# Patient Record
Sex: Male | Born: 1960 | Race: White | Hispanic: No | Marital: Married | Smoking: Never smoker
Health system: Southern US, Community
[De-identification: ages and names within clinical notes are randomized; demographics above are authoritative.]

## PROBLEM LIST (undated history)

## (undated) DIAGNOSIS — D126 Benign neoplasm of colon, unspecified: Secondary | ICD-10-CM

## (undated) DIAGNOSIS — K219 Gastro-esophageal reflux disease without esophagitis: Secondary | ICD-10-CM

## (undated) DIAGNOSIS — G8929 Other chronic pain: Secondary | ICD-10-CM

## (undated) DIAGNOSIS — E559 Vitamin D deficiency, unspecified: Secondary | ICD-10-CM

## (undated) DIAGNOSIS — K2210 Ulcer of esophagus without bleeding: Secondary | ICD-10-CM

## (undated) DIAGNOSIS — Z8719 Personal history of other diseases of the digestive system: Secondary | ICD-10-CM

## (undated) DIAGNOSIS — E785 Hyperlipidemia, unspecified: Secondary | ICD-10-CM

## (undated) DIAGNOSIS — K2970 Gastritis, unspecified, without bleeding: Secondary | ICD-10-CM

## (undated) DIAGNOSIS — E349 Endocrine disorder, unspecified: Secondary | ICD-10-CM

## (undated) DIAGNOSIS — T7840XA Allergy, unspecified, initial encounter: Secondary | ICD-10-CM

## (undated) DIAGNOSIS — K5790 Diverticulosis of intestine, part unspecified, without perforation or abscess without bleeding: Secondary | ICD-10-CM

## (undated) DIAGNOSIS — E291 Testicular hypofunction: Secondary | ICD-10-CM

## (undated) DIAGNOSIS — K649 Unspecified hemorrhoids: Secondary | ICD-10-CM

## (undated) HISTORY — PX: COLONOSCOPY: SHX174

## (undated) HISTORY — PX: HERNIA REPAIR: SHX51

## (undated) HISTORY — PX: ESOPHAGOGASTRODUODENOSCOPY: SHX1529

---
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
## 2007-04-23 ENCOUNTER — Ambulatory Visit: Payer: Self-pay | Admitting: Internal Medicine

## 2007-07-09 ENCOUNTER — Ambulatory Visit: Payer: Self-pay | Admitting: Rheumatology

## 2007-08-27 ENCOUNTER — Ambulatory Visit: Payer: Self-pay | Admitting: Surgery

## 2007-08-27 ENCOUNTER — Other Ambulatory Visit: Payer: Self-pay

## 2007-09-04 ENCOUNTER — Ambulatory Visit: Payer: Self-pay | Admitting: Surgery

## 2008-03-10 ENCOUNTER — Ambulatory Visit: Payer: Self-pay | Admitting: Family Medicine

## 2008-08-16 ENCOUNTER — Ambulatory Visit: Payer: Self-pay | Admitting: Gastroenterology

## 2008-09-15 ENCOUNTER — Ambulatory Visit: Payer: Self-pay | Admitting: Family Medicine

## 2009-03-15 ENCOUNTER — Ambulatory Visit: Payer: Self-pay | Admitting: Family Medicine

## 2009-10-13 ENCOUNTER — Ambulatory Visit: Payer: Self-pay | Admitting: Gastroenterology

## 2011-12-31 ENCOUNTER — Ambulatory Visit: Payer: Self-pay | Admitting: Gastroenterology

## 2012-09-16 ENCOUNTER — Ambulatory Visit: Payer: Self-pay | Admitting: Podiatry

## 2013-04-20 ENCOUNTER — Ambulatory Visit: Payer: Self-pay | Admitting: Surgery

## 2013-04-20 LAB — CBC WITH DIFFERENTIAL/PLATELET
HGB: 17.9 g/dL (ref 13.0–18.0)
Lymphocyte %: 28.6 %
Monocyte %: 10.4 %
Neutrophil %: 58.4 %
Platelet: 240 10*3/uL (ref 150–440)
RBC: 5.97 10*6/uL — ABNORMAL HIGH (ref 4.40–5.90)
RDW: 14.3 % (ref 11.5–14.5)
WBC: 10.1 10*3/uL (ref 3.8–10.6)

## 2013-04-27 ENCOUNTER — Ambulatory Visit: Payer: Self-pay | Admitting: Surgery

## 2014-03-01 ENCOUNTER — Ambulatory Visit: Payer: Self-pay | Admitting: Gastroenterology

## 2014-03-01 DIAGNOSIS — K2210 Ulcer of esophagus without bleeding: Secondary | ICD-10-CM

## 2014-03-01 HISTORY — DX: Ulcer of esophagus without bleeding: K22.10

## 2014-03-03 LAB — PATHOLOGY REPORT

## 2015-02-17 NOTE — Op Note (Signed)
PATIENT NAME:  Bryan Burch, Bryan Burch MR#:  [id number] DATE OF BIRTH:  04-14-1961  DATE OF PROCEDURE:  04/27/2013  PREOPERATIVE DIAGNOSIS: Umbilical hernia.   POSTOPERATIVE DIAGNOSIS: Umbilical hernia.   OPERATION: Umbilical hernia repair.   ANESTHESIA: General.   SURGEON: Micheline Maze, MD  ASSISTANT: Patience Musca, [employer] student.   DESCRIPTION OF PROCEDURE: With the patient in the supine position, after the induction of appropriate general anesthesia, the patient's abdomen was prepped with ChloraPrep and draped with sterile towels. A supraumbilical incision was made in the standard fashion and carried down bluntly through the subcutaneous tissue. Electrocautery was used to provide hemostasis. The hernia sac was identified and dissected back to its base, which appeared to be approximately a centimeter defect in the anterior abdominal wall. I did not see any other evidence of defects. The sac was dissected back to its base, the peritoneal fat removed where it could not be reduced. The defect was closed with a pants-over-vest closure of 0 Surgilon. The area was infiltrated with 0.5% Marcaine for postoperative pain control. The umbilical defect was reapproximated using 0 Vicryl, and the skin was clipped. Compressive dressing was applied. The patient was returned to the recovery room having tolerated the procedure well. Sponge, instrument and needle counts were correct x2 in the operating room.   ____________________________ Bryan Burch III, MD rle:OSi D: 04/27/2013 09:37:10 ET T: 04/27/2013 09:58:03 ET JOB#: [id number]  cc: Micheline Maze, MD, <Dictator> Sofie Hartigan, MD Bryan Goldmann MD ELECTRONICALLY SIGNED 04/30/2013 8:24

## 2016-11-28 ENCOUNTER — Encounter: Payer: Self-pay | Admitting: *Deleted

## 2016-11-29 ENCOUNTER — Ambulatory Visit
Admission: RE | Admit: 2016-11-29 | Discharge: 2016-11-29 | Disposition: A | Discharge status: Home / Self Care | Payer: 59 | Source: Ambulatory Visit | Attending: Gastroenterology | Admitting: Gastroenterology

## 2016-11-29 ENCOUNTER — Ambulatory Visit: Payer: 59 | Admitting: Anesthesiology

## 2016-11-29 ENCOUNTER — Encounter
Admission: RE | Disposition: A | Discharge status: Home / Self Care | Payer: Self-pay | Source: Ambulatory Visit | Attending: Gastroenterology

## 2016-11-29 ENCOUNTER — Encounter: Payer: Self-pay | Admitting: *Deleted

## 2016-11-29 DIAGNOSIS — K2950 Unspecified chronic gastritis without bleeding: Secondary | ICD-10-CM | POA: Insufficient documentation

## 2016-11-29 DIAGNOSIS — Z79899 Other long term (current) drug therapy: Secondary | ICD-10-CM | POA: Diagnosis not present

## 2016-11-29 DIAGNOSIS — Z800 Family history of malignant neoplasm of digestive organs: Secondary | ICD-10-CM | POA: Insufficient documentation

## 2016-11-29 DIAGNOSIS — Z9109 Other allergy status, other than to drugs and biological substances: Secondary | ICD-10-CM | POA: Insufficient documentation

## 2016-11-29 DIAGNOSIS — K210 Gastro-esophageal reflux disease with esophagitis: Secondary | ICD-10-CM | POA: Diagnosis not present

## 2016-11-29 DIAGNOSIS — E785 Hyperlipidemia, unspecified: Secondary | ICD-10-CM | POA: Diagnosis not present

## 2016-11-29 DIAGNOSIS — Z86010 Personal history of colonic polyps: Secondary | ICD-10-CM | POA: Diagnosis not present

## 2016-11-29 DIAGNOSIS — E559 Vitamin D deficiency, unspecified: Secondary | ICD-10-CM | POA: Insufficient documentation

## 2016-11-29 DIAGNOSIS — K224 Dyskinesia of esophagus: Secondary | ICD-10-CM | POA: Diagnosis not present

## 2016-11-29 DIAGNOSIS — K2270 Barrett's esophagus without dysplasia: Secondary | ICD-10-CM | POA: Insufficient documentation

## 2016-11-29 DIAGNOSIS — Z807 Family history of other malignant neoplasms of lymphoid, hematopoietic and related tissues: Secondary | ICD-10-CM | POA: Insufficient documentation

## 2016-11-29 HISTORY — DX: Hyperlipidemia, unspecified: E78.5

## 2016-11-29 HISTORY — PX: ESOPHAGOGASTRODUODENOSCOPY (EGD) WITH PROPOFOL: SHX5813

## 2016-11-29 HISTORY — DX: Personal history of other diseases of the digestive system: Z87.19

## 2016-11-29 HISTORY — DX: Allergy, unspecified, initial encounter: T78.40XA

## 2016-11-29 HISTORY — DX: Testicular hypofunction: E29.1

## 2016-11-29 HISTORY — DX: Vitamin D deficiency, unspecified: E55.9

## 2016-11-29 HISTORY — DX: Ulcer of esophagus without bleeding: K22.10

## 2016-11-29 HISTORY — DX: Unspecified hemorrhoids: K64.9

## 2016-11-29 HISTORY — DX: Gastritis, unspecified, without bleeding: K29.70

## 2016-11-29 HISTORY — DX: Diverticulosis of intestine, part unspecified, without perforation or abscess without bleeding: K57.90

## 2016-11-29 HISTORY — DX: Gastro-esophageal reflux disease without esophagitis: K21.9

## 2016-11-29 HISTORY — DX: Benign neoplasm of colon, unspecified: D12.6

## 2016-11-29 SURGERY — ESOPHAGOGASTRODUODENOSCOPY (EGD) WITH PROPOFOL
Anesthesia: General

## 2016-11-29 MED ORDER — SODIUM CHLORIDE 0.9 % IV SOLN
INTRAVENOUS | Status: DC
  Administered 2016-11-29: 14:00:00 via INTRAVENOUS
  Administered 2016-11-29: 1000 mL via INTRAVENOUS

## 2016-11-29 MED ORDER — LIDOCAINE HCL (CARDIAC) 20 MG/ML IV SOLN
INTRAVENOUS | Status: DC | PRN
  Administered 2016-11-29: 100 mg via INTRAVENOUS

## 2016-11-29 MED ORDER — PHENYLEPHRINE HCL 10 MG/ML IJ SOLN
INTRAMUSCULAR | Status: AC
  Filled 2016-11-29: qty 1

## 2016-11-29 MED ORDER — PROPOFOL 500 MG/50ML IV EMUL
INTRAVENOUS | Status: DC | PRN
  Administered 2016-11-29: 150 ug/kg/min via INTRAVENOUS

## 2016-11-29 MED ORDER — PHENYLEPHRINE HCL 10 MG/ML IJ SOLN
INTRAMUSCULAR | Status: DC | PRN
  Administered 2016-11-29: 200 ug via INTRAVENOUS

## 2016-11-29 MED ORDER — PROPOFOL 10 MG/ML IV BOLUS
INTRAVENOUS | Status: DC | PRN
  Administered 2016-11-29: 60 mg via INTRAVENOUS

## 2016-11-29 MED ORDER — MIDAZOLAM HCL 2 MG/2ML IJ SOLN
INTRAMUSCULAR | Status: DC | PRN
  Administered 2016-11-29: 2 mg via INTRAVENOUS

## 2016-11-29 MED ORDER — SODIUM CHLORIDE 0.9 % IV SOLN: INTRAVENOUS | Status: DC

## 2016-11-29 MED ORDER — LIDOCAINE HCL (PF) 2 % IJ SOLN
INTRAMUSCULAR | Status: AC
  Filled 2016-11-29: qty 2

## 2016-11-29 MED ORDER — PROPOFOL 500 MG/50ML IV EMUL
INTRAVENOUS | Status: AC
  Filled 2016-11-29: qty 50

## 2016-11-29 MED ORDER — MIDAZOLAM HCL 2 MG/2ML IJ SOLN
INTRAMUSCULAR | Status: AC
  Filled 2016-11-29: qty 2

## 2016-11-29 NOTE — Anesthesia Postprocedure Evaluation (Signed)
Anesthesia Post Note  Patient: YACOV TAPLEY  Procedure(s) Performed: Procedure(s) (LRB): ESOPHAGOGASTRODUODENOSCOPY (EGD) WITH PROPOFOL (N/A)  Patient location during evaluation: PACU Anesthesia Type: General Level of consciousness: awake Pain management: pain level controlled Vital Signs Assessment: post-procedure vital signs reviewed and stable Respiratory status: spontaneous breathing Cardiovascular status: stable Anesthetic complications: no     Last Vitals:  Vitals:   11/29/16 1350 11/29/16 1436  BP: 126/89 98/79  Pulse: 76 82  Resp: 18 (!) 30  Temp: 36.3 C (!) 35.9 C    Last Pain:  Vitals:   11/29/16 1436  TempSrc: Tympanic                 VAN STAVEREN,Siona Coulston

## 2016-11-29 NOTE — Anesthesia Post-op Follow-up Note (Cosign Needed)
Anesthesia QCDR form completed.        

## 2016-11-29 NOTE — Transfer of Care (Signed)
Immediate Anesthesia Transfer of Care Note  Patient: Bryan Burch  Procedure(s) Performed: Procedure(s): ESOPHAGOGASTRODUODENOSCOPY (EGD) WITH PROPOFOL (N/A)  Patient Location: Endoscopy Unit  Anesthesia Type:General  Level of Consciousness: awake and patient cooperative  Airway & Oxygen Therapy: Patient Spontanous Breathing and Patient connected to nasal cannula oxygen  Post-op Assessment: Report given to RN, Post -op Vital signs reviewed and stable and Patient moving all extremities X 4  Post vital signs: Reviewed and stable  Last Vitals:  Vitals:   11/29/16 1350 11/29/16 1436  BP: 126/89 (P) 98/79  Pulse: 76 (P) 82  Resp: 18 (!) (P) 30  Temp: 36.3 C (!) (P) 35.9 C    Last Pain:  Vitals:   11/29/16 1436  TempSrc: (P) Tympanic         Complications: No apparent anesthesia complications

## 2016-11-29 NOTE — Op Note (Signed)
[facility] Patient Name: Bryan Burch Procedure Date: 11/29/2016 2:13 PM MRN: [id number] Account #: [id number] Date of Birth: 07/22/1961 Admit Type: Outpatient Age: 56 Room: [facility] ENDO ROOM 3 Gender: Male Note Status: Finalized Procedure:            Upper GI endoscopy Indications:          Follow-up of Barrett's esophagus Providers:            Lollie Sails, MD Referring MD:         Bo Mcclintock. Vickki Muff (Referring MD) Medicines:            Monitored Anesthesia Care Complications:        No immediate complications. Procedure:            Pre-Anesthesia Assessment:                       - ASA Grade Assessment: II - A patient with mild                        systemic disease.                       After obtaining informed consent, the endoscope was                        passed under direct vision. Throughout the procedure,                        the patient's blood pressure, pulse, and oxygen                        saturations were monitored continuously. The Endoscope                        was introduced through the mouth, and advanced to the                        third part of duodenum. The upper GI endoscopy was                        accomplished without difficulty. The patient tolerated                        the procedure well. Findings:      There were esophageal mucosal changes secondary to established       short-segment Barrett's disease present at the gastroesophageal       junction. The maximum longitudinal extent of these mucosal changes was 1       cm in length. Mucosa was biopsied with a cold forceps for histology in 4       quadrants at 40 cm from the incisors. One specimen bottle was sent to       pathology.      Abnormal motility was noted in the distal esophagus. The cricopharyngeus       was normal. There is spasticity of the esophageal body.      The cardia and gastric fundus were normal on retroflexion.      Localized minimal  inflammation characterized by congestion (edema),       erythema and granularity was found in the gastric antrum. Biopsies were  taken with a cold forceps for histology. Biopsies were taken with a cold       forceps for Helicobacter pylori testing.      The examined duodenum was normal. Impression:           - Esophageal mucosal changes secondary to established                        short-segment Barrett's disease. Biopsied.                       - Abnormal esophageal motility.                       - Gastritis. Biopsied.                       - Normal examined duodenum. Recommendation:       - Continue present medications.                       - Await pathology results.                       - Telephone GI clinic for pathology results in 1 week.                       - Soft diet for 2 days. Procedure Code(s):    --- Professional ---                       [phone], Esophagogastroduodenoscopy, flexible, transoral;                        with biopsy, single or multiple Diagnosis Code(s):    --- Professional ---                       K22.70, Barrett's esophagus without dysplasia                       K22.4, Dyskinesia of esophagus                       K29.70, Gastritis, unspecified, without bleeding CPT copyright 2016 American Medical Association. All rights reserved. The codes documented in this report are preliminary and upon coder review may  be revised to meet current compliance requirements. Lollie Sails, MD 11/29/2016 2:36:06 PM This report has been signed electronically. Number of Addenda: 0 Note Initiated On: 11/29/2016 2:13 PM      [facility]

## 2016-11-29 NOTE — Anesthesia Preprocedure Evaluation (Signed)
Anesthesia Evaluation  Patient identified by MRN, date of birth, ID band Patient awake    Reviewed: Allergy & Precautions, NPO status , Patient's Chart, lab work & pertinent test results  Airway Mallampati: I       Dental  (+) Teeth Intact   Pulmonary neg pulmonary ROS,    breath sounds clear to auscultation       Cardiovascular Exercise Tolerance: Good  Rhythm:Regular     Neuro/Psych negative neurological ROS     GI/Hepatic Neg liver ROS, GERD  Medicated,  Endo/Other  Morbid obesity  Renal/GU negative Renal ROS     Musculoskeletal negative musculoskeletal ROS (+)   Abdominal (+) + obese,   Peds negative pediatric ROS (+)  Hematology negative hematology ROS (+)   Anesthesia Other Findings   Reproductive/Obstetrics                             Anesthesia Physical Anesthesia Plan  ASA: II  Anesthesia Plan: General   Post-op Pain Management:    Induction: Intravenous  Airway Management Planned: Natural Airway and Nasal Cannula  Additional Equipment:   Intra-op Plan:   Post-operative Plan:   Informed Consent: I have reviewed the patients History and Physical, chart, labs and discussed the procedure including the risks, benefits and alternatives for the proposed anesthesia with the patient or authorized representative who has indicated his/her understanding and acceptance.     Plan Discussed with: CRNA and Surgeon  Anesthesia Plan Comments:         Anesthesia Quick Evaluation

## 2016-11-29 NOTE — H&P (Signed)
Outpatient short stay form Pre-procedure 11/29/2016 2:11 PM Lollie Sails MD  Primary Physician: Dr. Kirkland Hun  Reason for visit:  EGD  History of present illness:  Patient is a 56 year old male presenting today as above. Is personal history of Barrett's esophagus. He is presenting today for surveillance procedure. He takes no aspirin or blood thinning agents. He continues on a proton pump inhibitor, DEXilant, this seems to be very effective for him.    Current Facility-Administered Medications:  .  0.9 %  sodium chloride infusion, , Intravenous, Continuous, Lollie Sails, MD, Last Rate: 20 mL/hr at 11/29/16 1405, 1,000 mL at 11/29/16 1405 .  0.9 %  sodium chloride infusion, , Intravenous, Continuous, Lollie Sails, MD  Prescriptions Prior to Admission  Medication Sig Dispense Refill Last Dose  . acyclovir (ZOVIRAX) 400 MG tablet Take 400 mg by mouth 3 (three) times daily.   Past Week at Unknown time  . cetirizine (ZYRTEC) 10 MG tablet Take 10 mg by mouth daily.   11/28/2016 at Unknown time  . chlorpheniramine (CHLOR-TRIMETON) 4 MG tablet Take 4 mg by mouth at bedtime as needed for allergies.   Past Week at Unknown time  . Cholecalciferol (VITAMIN D3) 1000 units CAPS Take 3,000 Units by mouth daily.   11/28/2016 at Unknown time  . Cyanocobalamin (VITAMIN B 12 PO) Take by mouth daily.   11/28/2016 at Unknown time  . dexlansoprazole (DEXILANT) 60 MG capsule Take 60 mg by mouth daily.   11/28/2016 at Unknown time  . niacin 100 MG tablet Take 100 mg by mouth at bedtime.   11/28/2016 at Unknown time  . rosuvastatin (CRESTOR) 40 MG tablet Take 40 mg by mouth daily.   11/28/2016 at Unknown time  . testosterone (ANDROGEL) 50 MG/5GM (1%) GEL Place 5 g onto the skin daily.   Past Week at Unknown time  . traMADol-acetaminophen (ULTRACET) 37.5-325 MG tablet Take 1 tablet by mouth every 6 (six) hours as needed.   Past Week at Unknown time     Not on File   Past Medical History:  Diagnosis  Date  . Allergic state   . Androgen deficiency   . Diverticulosis   . Erosive esophagitis 03/01/2014  . Gastritis   . GERD (gastroesophageal reflux disease)   . Hemorrhoids   . History of Barrett's esophagus   . Hyperlipidemia   . Tubular adenoma of colon   . Vitamin D deficiency     Review of systems:      Physical Exam    Heart and lungs: Regular rate and rhythm without rub or gallop, lungs are bilaterally clear.    HEENT: Normocephalic atraumatic eyes are anicteric    Other:     Pertinant exam for procedure: Soft nontender nondistended bowel sounds positive normoactive.    Planned proceedures: EGD and indicated procedures. I have discussed the risks benefits and complications of procedures to include not limited to bleeding, infection, perforation and the risk of sedation and the patient wishes to proceed.    Lollie Sails, MD Gastroenterology 11/29/2016  2:11 PM

## 2016-12-02 ENCOUNTER — Encounter: Payer: Self-pay | Admitting: Gastroenterology

## 2016-12-03 LAB — SURGICAL PATHOLOGY

## 2019-02-18 ENCOUNTER — Other Ambulatory Visit (HOSPITAL_COMMUNITY): Payer: Self-pay | Admitting: Gastroenterology

## 2019-02-18 ENCOUNTER — Other Ambulatory Visit: Payer: Self-pay | Admitting: Gastroenterology

## 2019-02-18 DIAGNOSIS — R1314 Dysphagia, pharyngoesophageal phase: Secondary | ICD-10-CM

## 2019-03-29 ENCOUNTER — Other Ambulatory Visit: Payer: Self-pay

## 2019-03-29 ENCOUNTER — Ambulatory Visit
Admission: RE | Admit: 2019-03-29 | Discharge: 2019-03-29 | Disposition: A | Discharge status: Home / Self Care | Payer: No Typology Code available for payment source | Source: Ambulatory Visit | Attending: Gastroenterology | Admitting: Gastroenterology

## 2019-03-29 DIAGNOSIS — R1314 Dysphagia, pharyngoesophageal phase: Secondary | ICD-10-CM | POA: Diagnosis present

## 2019-04-20 ENCOUNTER — Other Ambulatory Visit
Admission: RE | Admit: 2019-04-20 | Discharge: 2019-04-20 | Disposition: A | Discharge status: Home / Self Care | Payer: No Typology Code available for payment source | Source: Ambulatory Visit | Attending: Gastroenterology | Admitting: Gastroenterology

## 2019-04-20 ENCOUNTER — Other Ambulatory Visit: Payer: Self-pay

## 2019-04-20 DIAGNOSIS — Z1159 Encounter for screening for other viral diseases: Secondary | ICD-10-CM | POA: Insufficient documentation

## 2019-04-22 ENCOUNTER — Encounter: Payer: Self-pay | Admitting: *Deleted

## 2019-04-22 LAB — NOVEL CORONAVIRUS, NAA (HOSP ORDER, SEND-OUT TO REF LAB; TAT 18-24 HRS): SARS-CoV-2, NAA: NOT DETECTED

## 2019-04-23 ENCOUNTER — Other Ambulatory Visit: Payer: Self-pay

## 2019-04-23 ENCOUNTER — Encounter: Payer: Self-pay | Admitting: Gastroenterology

## 2019-04-23 ENCOUNTER — Ambulatory Visit: Payer: No Typology Code available for payment source | Admitting: Anesthesiology

## 2019-04-23 ENCOUNTER — Ambulatory Visit
Admission: RE | Admit: 2019-04-23 | Discharge: 2019-04-23 | Disposition: A | Discharge status: Home / Self Care | Payer: No Typology Code available for payment source | Attending: Gastroenterology | Admitting: Gastroenterology

## 2019-04-23 ENCOUNTER — Encounter
Admission: RE | Disposition: A | Discharge status: Home / Self Care | Payer: Self-pay | Source: Home / Self Care | Attending: Gastroenterology

## 2019-04-23 DIAGNOSIS — E559 Vitamin D deficiency, unspecified: Secondary | ICD-10-CM | POA: Insufficient documentation

## 2019-04-23 DIAGNOSIS — K219 Gastro-esophageal reflux disease without esophagitis: Secondary | ICD-10-CM | POA: Diagnosis not present

## 2019-04-23 DIAGNOSIS — E669 Obesity, unspecified: Secondary | ICD-10-CM | POA: Insufficient documentation

## 2019-04-23 DIAGNOSIS — K2950 Unspecified chronic gastritis without bleeding: Secondary | ICD-10-CM | POA: Diagnosis not present

## 2019-04-23 DIAGNOSIS — Z888 Allergy status to other drugs, medicaments and biological substances status: Secondary | ICD-10-CM | POA: Diagnosis not present

## 2019-04-23 DIAGNOSIS — E291 Testicular hypofunction: Secondary | ICD-10-CM | POA: Insufficient documentation

## 2019-04-23 DIAGNOSIS — E785 Hyperlipidemia, unspecified: Secondary | ICD-10-CM | POA: Insufficient documentation

## 2019-04-23 DIAGNOSIS — Z7982 Long term (current) use of aspirin: Secondary | ICD-10-CM | POA: Diagnosis not present

## 2019-04-23 DIAGNOSIS — K2960 Other gastritis without bleeding: Secondary | ICD-10-CM | POA: Insufficient documentation

## 2019-04-23 DIAGNOSIS — Z6837 Body mass index (BMI) 37.0-37.9, adult: Secondary | ICD-10-CM | POA: Insufficient documentation

## 2019-04-23 DIAGNOSIS — R1310 Dysphagia, unspecified: Secondary | ICD-10-CM | POA: Diagnosis present

## 2019-04-23 DIAGNOSIS — Z8719 Personal history of other diseases of the digestive system: Secondary | ICD-10-CM | POA: Insufficient documentation

## 2019-04-23 DIAGNOSIS — Z79899 Other long term (current) drug therapy: Secondary | ICD-10-CM | POA: Diagnosis not present

## 2019-04-23 HISTORY — DX: Endocrine disorder, unspecified: E34.9

## 2019-04-23 HISTORY — DX: Other chronic pain: G89.29

## 2019-04-23 HISTORY — PX: ESOPHAGOGASTRODUODENOSCOPY (EGD) WITH PROPOFOL: SHX5813

## 2019-04-23 SURGERY — ESOPHAGOGASTRODUODENOSCOPY (EGD) WITH PROPOFOL
Anesthesia: General

## 2019-04-23 MED ORDER — SODIUM CHLORIDE 0.9 % IV SOLN
INTRAVENOUS | Status: DC
  Administered 2019-04-23: 12:00:00 1000 mL via INTRAVENOUS

## 2019-04-23 MED ORDER — PROPOFOL 10 MG/ML IV BOLUS
INTRAVENOUS | Status: AC
  Filled 2019-04-23: qty 40

## 2019-04-23 MED ORDER — PROPOFOL 10 MG/ML IV BOLUS
INTRAVENOUS | Status: DC | PRN
  Administered 2019-04-23 (×2): 50 mg via INTRAVENOUS

## 2019-04-23 MED ORDER — PROPOFOL 500 MG/50ML IV EMUL
INTRAVENOUS | Status: AC
  Filled 2019-04-23: qty 100

## 2019-04-23 MED ORDER — LIDOCAINE HCL (CARDIAC) PF 100 MG/5ML IV SOSY
PREFILLED_SYRINGE | INTRAVENOUS | Status: DC | PRN
  Administered 2019-04-23: 32 mg via INTRAVENOUS

## 2019-04-23 MED ORDER — LIDOCAINE HCL (PF) 2 % IJ SOLN
INTRAMUSCULAR | Status: AC
  Filled 2019-04-23: qty 10

## 2019-04-23 MED ORDER — SODIUM CHLORIDE 0.9 % IV SOLN: INTRAVENOUS | Status: DC

## 2019-04-23 MED ORDER — PROPOFOL 500 MG/50ML IV EMUL
INTRAVENOUS | Status: DC | PRN
  Administered 2019-04-23: 150 ug/kg/min via INTRAVENOUS

## 2019-04-23 NOTE — Op Note (Signed)
[facility] Patient Name: Bryan Burch Procedure Date: 04/23/2019 11:42 AM MRN: [id number] Account #: [id number] Date of Birth: 09-19-1961 Admit Type: Outpatient Age: 58 Room: [facility] ENDO ROOM 2 Gender: Male Note Status: Finalized Procedure:            Upper GI endoscopy Indications:          Dysphagia Providers:            Lollie Sails, MD Medicines:            Monitored Anesthesia Care Complications:        No immediate complications. Procedure:            Pre-Anesthesia Assessment:                       - ASA Grade Assessment: II - A patient with mild                        systemic disease.                       After obtaining informed consent, the endoscope was                        passed under direct vision. Throughout the procedure,                        the patient's blood pressure, pulse, and oxygen                        saturations were monitored continuously. The Endoscope                        was introduced through the mouth, and advanced to the                        third part of duodenum. The upper GI endoscopy was                        accomplished without difficulty. The patient tolerated                        the procedure well. Findings:      The Z-line was variable. Biopsies were taken with a cold forceps for       histology in a quadrant manner (after dilation below was done).      Abnormal motility was noted in the middle third of the esophagus and in       the lower third of the esophagus. The cricopharyngeus was normal. There       is spasticity and extra peristaltic waves of the esophageal body. The       distal esophagus/lower esophageal sphincter is open. Tertiary       peristaltic waves are noted.      Patchy minimal inflammation characterized by congestion (edema) and       erythema was found in the gastric body. Biopsies were taken with a cold       forceps for histology.      The examined duodenum was  normal.      A TTS dilator was passed through the scope. Dilation with a 15-16.5-18       mm balloon  dilator was performed to 17 mm in the lower third of the       esophagus. Evidence of spasm, no ring or stenosis. Impression:           - Z-line variable. Biopsied.                       - Abnormal esophageal motility, suspicious for                        presbyesophagus.                       - Gastritis. Biopsied.                       - Normal examined duodenum.                       - Dilation performed in the lower third of the                        esophagus. Recommendation:       - Discharge patient to home.                       - Continue present medications.                       - Await pathology results.                       - Return to GI clinic in 4 weeks.                       - Full liquid diet today.                       - Soft diet for 1 day, then advance as tolerated to                        advance diet as tolerated. Procedure Code(s):    --- Professional ---                       [phone], Esophagogastroduodenoscopy, flexible, transoral;                        with transendoscopic balloon dilation of esophagus                        (less than 30 mm diameter) CPT copyright 2019 American Medical Association. All rights reserved. The codes documented in this report are preliminary and upon coder review may  be revised to meet current compliance requirements. Lollie Sails, MD 04/23/2019 1:19:17 PM This report has been signed electronically. Number of Addenda: 0 Note Initiated On: 04/23/2019 11:42 AM      [facility]

## 2019-04-23 NOTE — Anesthesia Preprocedure Evaluation (Addendum)
Anesthesia Evaluation  Patient identified by MRN, date of birth, ID band Patient awake    Reviewed: Allergy & Precautions, H&P , NPO status , Patient's Chart, lab work & pertinent test results  Airway Mallampati: III  TM Distance: >3 FB     Dental  (+) Chipped   Pulmonary neg pulmonary ROS, neg shortness of breath, neg COPD,           Cardiovascular (-) Past MI and (-) Cardiac Stents negative cardio ROS  (-) dysrhythmias      Neuro/Psych negative neurological ROS  negative psych ROS   GI/Hepatic Neg liver ROS, PUD, GERD  Controlled,Barrett's esophagus H/o erosive esophagitis   Endo/Other  negative endocrine ROS  Renal/GU negative Renal ROS  negative genitourinary   Musculoskeletal   Abdominal   Peds  Hematology negative hematology ROS (+)   Anesthesia Other Findings Obese  Past Medical History: No date: Allergic state No date: Androgen deficiency No date: Chronic neck pain No date: Diverticulosis 03/01/2014: Erosive esophagitis No date: Gastritis No date: GERD (gastroesophageal reflux disease) No date: Hemorrhoids No date: History of Barrett's esophagus No date: Hyperlipidemia No date: Testosterone deficiency No date: Tubular adenoma of colon No date: Vitamin D deficiency  Past Surgical History: No date: COLONOSCOPY No date: ESOPHAGOGASTRODUODENOSCOPY 11/29/2016: ESOPHAGOGASTRODUODENOSCOPY (EGD) WITH PROPOFOL; N/A     Comment:  Procedure: ESOPHAGOGASTRODUODENOSCOPY (EGD) WITH               PROPOFOL;  Surgeon: Lollie Sails, MD;  Location:               [facility] ENDOSCOPY;  Service: Endoscopy;  Laterality: N/A; No date: HERNIA REPAIR 1965: TONSILLECTOMY     Reproductive/Obstetrics negative OB ROS                            Anesthesia Physical Anesthesia Plan  ASA: II  Anesthesia Plan: General   Post-op Pain Management:    Induction:   PONV Risk Score and Plan:  Propofol infusion and TIVA  Airway Management Planned: Natural Airway and Nasal Cannula  Additional Equipment:   Intra-op Plan:   Post-operative Plan:   Informed Consent: I have reviewed the patients History and Physical, chart, labs and discussed the procedure including the risks, benefits and alternatives for the proposed anesthesia with the patient or authorized representative who has indicated his/her understanding and acceptance.     Dental Advisory Given  Plan Discussed with: Anesthesiologist and CRNA  Anesthesia Plan Comments:         Anesthesia Quick Evaluation

## 2019-04-23 NOTE — Transfer of Care (Signed)
Immediate Anesthesia Transfer of Care Note  Patient: Bryan Burch  Procedure(s) Performed: ESOPHAGOGASTRODUODENOSCOPY (EGD) WITH PROPOFOL (N/A )  Patient Location: PACU and Endoscopy Unit  Anesthesia Type:General  Level of Consciousness: awake  Airway & Oxygen Therapy: Patient Spontanous Breathing  Post-op Assessment: Report given to RN  Post vital signs: stable  Last Vitals:  Vitals Value Taken Time  BP 92/68 04/23/19 1319  Temp 36.4 C 04/23/19 1319  Pulse 41 04/23/19 1320  Resp 12 04/23/19 1320  SpO2 96 % 04/23/19 1320  Vitals shown include unvalidated device data.  Last Pain:  Vitals:   04/23/19 1319  TempSrc: Tympanic  PainSc: 0-No pain         Complications: No apparent anesthesia complications

## 2019-04-23 NOTE — Anesthesia Post-op Follow-up Note (Signed)
Anesthesia QCDR form completed.        

## 2019-04-23 NOTE — H&P (Signed)
Outpatient short stay form Pre-procedure 04/23/2019 12:30 PM Lollie Sails MD  Primary Physician: Dr. Norman Clay  Reason for visit: EGD  History of present illness: Patient is a 58 year old male presenting today for an EGD in regards to his personal history of Barrett's esophagus and problem with some dysphagia.  This is been feeling like things are slowing down through the region of the cervical esophagus.  Did however have a barium swallow on 03/29/2019 showing a possible distal ring versus spasm as well as some presbyesophagus changes in the mid to distal esophagus.  The cervical region was normal with the exception of a little bit of "wrinkling" of the area of the anterior limiting ligament of the vertebra.  There is no evidence of stricture or stenosis or other lesions throughout that area.  Patient takes 81 mg aspirin that is been held for several days.  He takes no other aspirin products or blood thinning agent.  Have discussed his barium swallow at length and possible dilatation.    Current Facility-Administered Medications:  .  0.9 %  sodium chloride infusion, , Intravenous, Continuous, Lollie Sails, MD, Last Rate: 20 mL/hr at 04/23/19 1159, 1,000 mL at 04/23/19 1159 .  0.9 %  sodium chloride infusion, , Intravenous, Continuous, Lollie Sails, MD  Medications Prior to Admission  Medication Sig Dispense Refill Last Dose  . acyclovir (ZOVIRAX) 400 MG tablet Take 400 mg by mouth 3 (three) times daily.   Past Week at Unknown time  . azelastine (ASTELIN) 0.1 % nasal spray Place into both nostrils 2 (two) times daily. Use in each nostril as directed   Past Week at Unknown time  . cetirizine (ZYRTEC) 10 MG tablet Take 10 mg by mouth daily.   Past Week at Unknown time  . chlorpheniramine (CHLOR-TRIMETON) 4 MG tablet Take 4 mg by mouth at bedtime as needed for allergies.   Past Week at Unknown time  . Cholecalciferol (VITAMIN D3) 1000 units CAPS Take 3,000 Units by mouth daily.    Past Week at Unknown time  . Cyanocobalamin (VITAMIN B 12 PO) Take by mouth daily.   Past Week at Unknown time  . dexlansoprazole (DEXILANT) 60 MG capsule Take 60 mg by mouth daily.   Past Week at Unknown time  . ezetimibe (ZETIA) 10 MG tablet Take 10 mg by mouth daily.   Past Week at Unknown time  . gabapentin (NEURONTIN) 300 MG capsule Take 300 mg by mouth 3 (three) times daily.   Past Week at Unknown time  . levocetirizine (XYZAL) 5 MG tablet Take 5 mg by mouth every evening.   Past Week at Unknown time  . montelukast (SINGULAIR) 10 MG tablet Take 10 mg by mouth at bedtime.   Past Week at Unknown time  . Naltrexone-buPROPion HCl ER 8-90 MG TB12 Take 8-90 mg by mouth daily.   Past Week at Unknown time  . niacin 100 MG tablet Take 100 mg by mouth at bedtime.   Past Week at Unknown time  . rosuvastatin (CRESTOR) 40 MG tablet Take 40 mg by mouth daily.   Past Week at Unknown time  . testosterone (ANDROGEL) 50 MG/5GM (1%) GEL Place 5 g onto the skin daily.   Past Week at Unknown time  . traMADol-acetaminophen (ULTRACET) 37.5-325 MG tablet Take 1 tablet by mouth every 6 (six) hours as needed.   Past Week at Unknown time     Allergies  Allergen Reactions  . Simvastatin     Muscle  pain      Past Medical History:  Diagnosis Date  . Allergic state   . Androgen deficiency   . Chronic neck pain   . Diverticulosis   . Erosive esophagitis 03/01/2014  . Gastritis   . GERD (gastroesophageal reflux disease)   . Hemorrhoids   . History of Barrett's esophagus   . Hyperlipidemia   . Testosterone deficiency   . Tubular adenoma of colon   . Vitamin D deficiency     Review of systems:      Physical Exam    Heart and lungs: Regular rate and rhythm without rub or gallop, lungs are bilaterally clear.    HEENT: Normocephalic atraumatic eyes are anicteric    Other:    Pertinant exam for procedure: Soft nontender nondistended bowel sounds positive normoactive, protuberant.    Planned  proceedures: EGD and indicated procedures. I have discussed the risks benefits and complications of procedures to include not limited to bleeding, infection, perforation and the risk of sedation and the patient wishes to proceed.    Lollie Sails, MD Gastroenterology 04/23/2019  12:30 PM

## 2019-04-26 NOTE — Anesthesia Postprocedure Evaluation (Signed)
Anesthesia Post Note  Patient: Bryan Burch  Procedure(s) Performed: ESOPHAGOGASTRODUODENOSCOPY (EGD) WITH PROPOFOL (N/A )  Patient location during evaluation: PACU Anesthesia Type: General Level of consciousness: awake and alert Pain management: pain level controlled Vital Signs Assessment: post-procedure vital signs reviewed and stable Respiratory status: spontaneous breathing, nonlabored ventilation and respiratory function stable Cardiovascular status: blood pressure returned to baseline and stable Postop Assessment: no apparent nausea or vomiting Anesthetic complications: no     Last Vitals:  Vitals:   04/23/19 1338 04/23/19 1339  BP: 117/81   Pulse: 76 83  Resp: (!) 24 (!) 27  Temp:    SpO2: 96% 97%    Last Pain:  Vitals:   04/23/19 1339  TempSrc:   PainSc: 0-No pain                 Durenda Hurt

## 2019-04-27 LAB — SURGICAL PATHOLOGY

## 2020-03-06 IMAGING — RF ESOPHAGUS/BARIUM SWALLOW/TABLET STUDY
11 of 14 series · 14 of 24 positions shown · non-contrast
Comparison: None.

CLINICAL DATA: Barrett's esophagus, dysphagia

EXAM:
ESOPHOGRAM / BARIUM SWALLOW / BARIUM TABLET STUDY
TECHNIQUE: Combined double contrast and single contrast examination performed
using effervescent crystals, thick barium liquid, and thin barium
liquid. The patient was observed with fluoroscopy swallowing a 13 mm
barium sulphate tablet.
FLUOROSCOPY TIME:  Fluoroscopy Time:  1 minutes
Radiation Exposure Index (if provided by the fluoroscopic device):
14.7 mGy
Number of Acquired Spot Images: 0

[Series 1: cp_standard · 0.25mm/px · 2 of 14 frames shown (1 of 11)]
[frame 3/14]
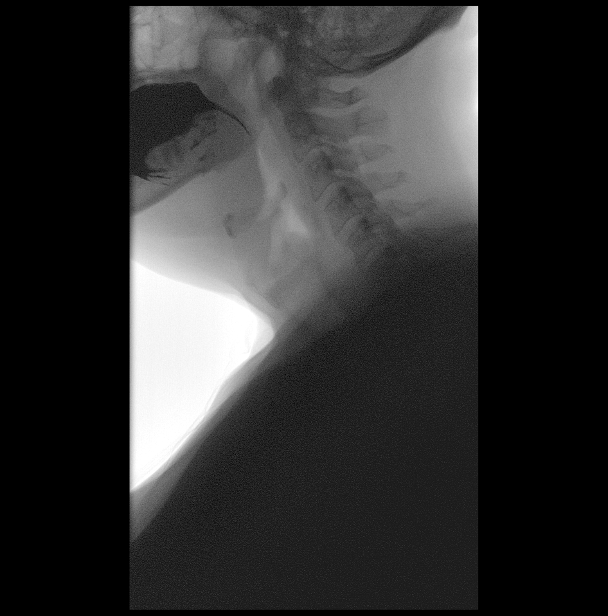
[frame 12/14]
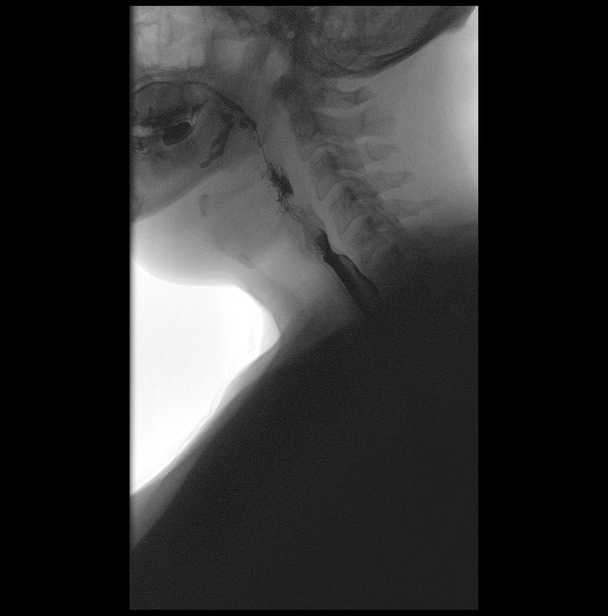

[Series 2: cp_standard · 0.25mm/px · 1 of 40 frames shown (2 of 11)]
[frame 7/40]
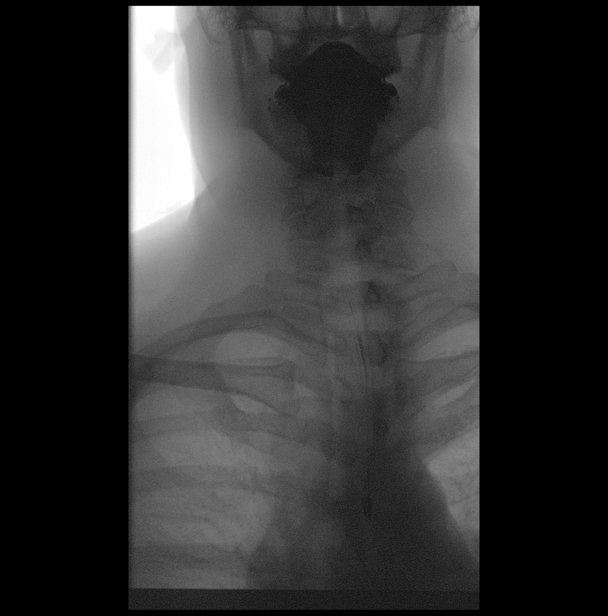

[Series 3: cp_standard · 0.25mm/px · 1 of 1 slices shown (3 of 11)]
[im 1/1]
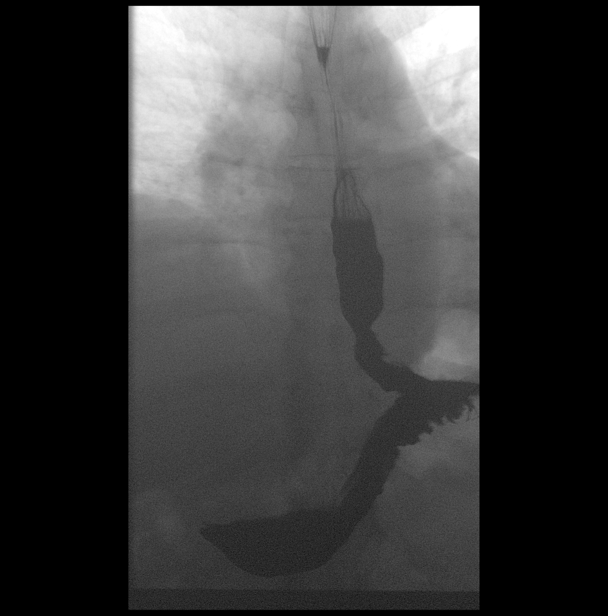

[Series 4: cp_standard · 0.25mm/px · 2 of 30 frames shown (4 of 11)]
[frame 5/30]
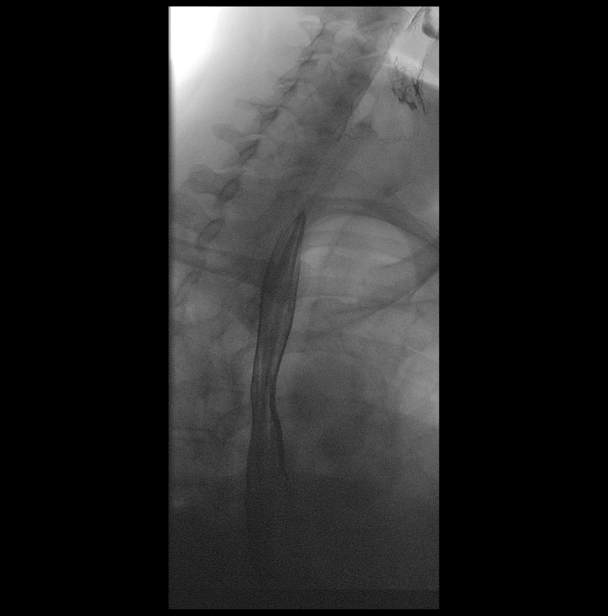
[frame 21/30]
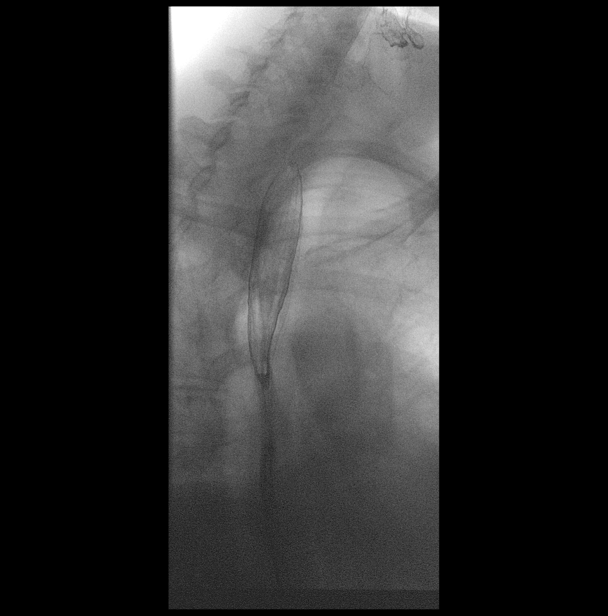

[Series 5: cp_standard · 0.25mm/px · 1 of 1 slices shown (5 of 11)]
[im 1/1]
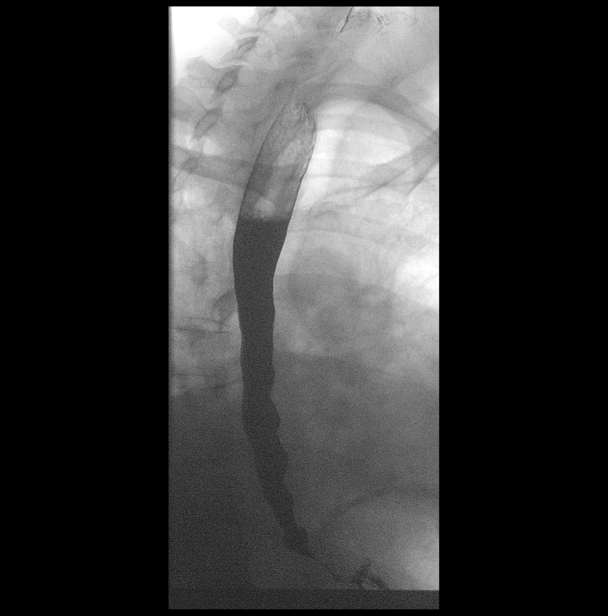

[Series 6: cp_standard · 0.25mm/px · 1 of 1 slices shown (6 of 11)]
[im 1/1]
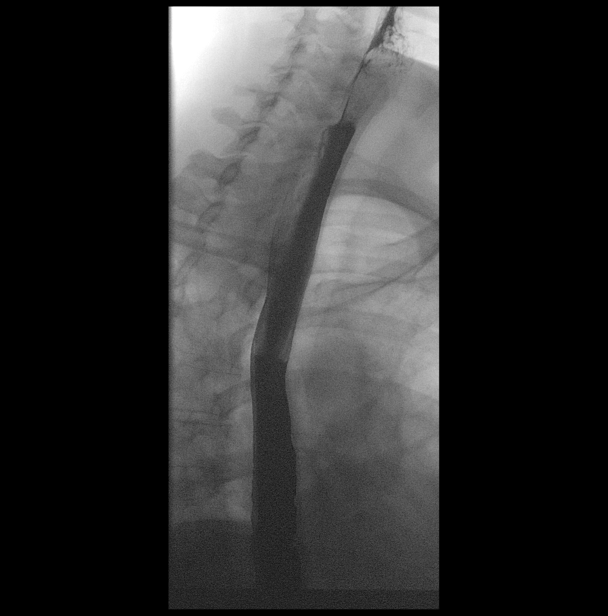

[Series 7: cp_standard · 0.25mm/px · 2 of 12 frames shown (7 of 11)]
[frame 2/12]
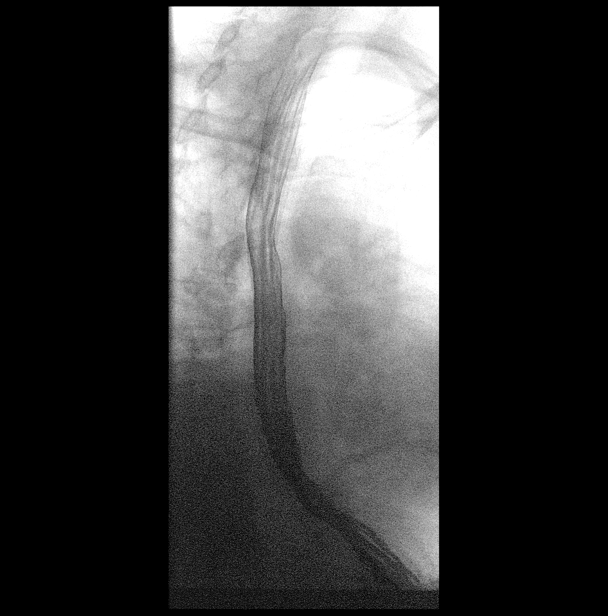
[frame 11/12]
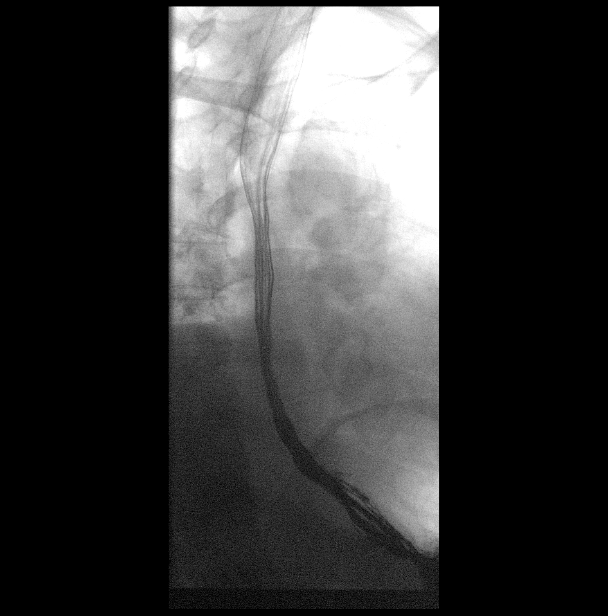

[Series 10: cp_standard · 0.27mm/px · 1 of 1 slices shown (8 of 11)]
[im 1/1]
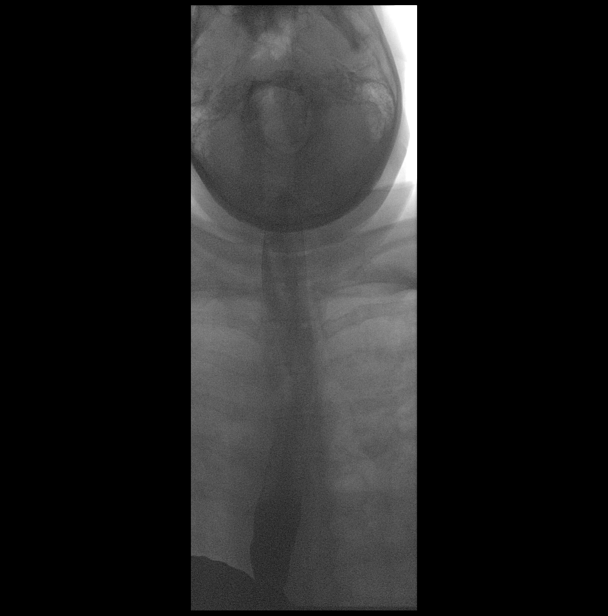

[Series 11: cp_standard · 0.27mm/px · 1 of 1 slices shown (9 of 11)]
[im 1/1]
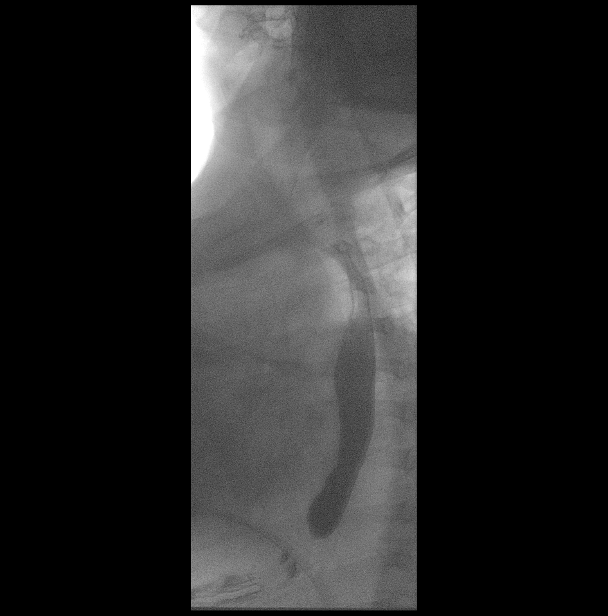

[Series 13: cp_standard · 0.28mm/px · 1 of 1 slices shown (10 of 11)]
[im 1/1]
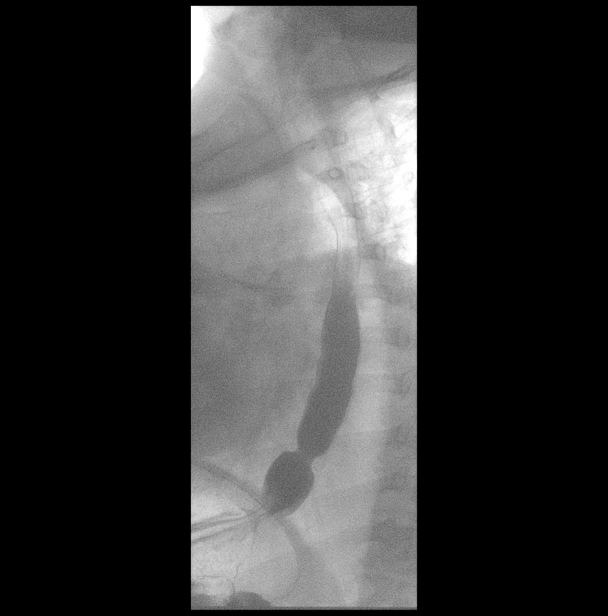

[Series 15: cp_standard · 0.26mm/px · 1 of 1 slices shown (11 of 11)]
[im 1/1]
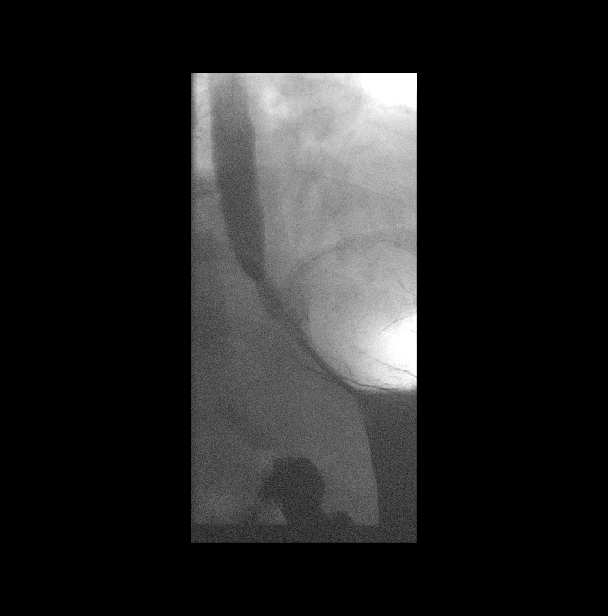

[14 of 24 positions shown; findings below may reference images not displayed]

FINDINGS: There was normal pharyngeal anatomy and motility. Contrast flowed
freely through the esophagus without evidence of mass. There was
normal esophageal mucosa without evidence of irregularity or
ulceration. Tertiary contractions of the distal third of the
esophagus as can be seen with mild spasm. Mild short-segment
narrowing of the distal esophagus just proximal to the
esophagogastric junction most concerning for mild stricture
restricting the passage of a 13 mm barium tablet. Moderate
gastroesophageal reflux extending to the level of the midthoracic
esophagus. No definite hiatal hernia was demonstrated.
IMPRESSION: 1. Tertiary contractions of the distal third of the esophagus as can
be seen with mild spasm.
2. Mild short-segment narrowing of the distal esophagus just
proximal to the esophagogastric junction most concerning for mild
stricture restricting the passage of a 13 mm barium tablet.
3. Moderate gastroesophageal reflux extending to the level of the
midthoracic esophagus.

## 2024-07-22 ENCOUNTER — Other Ambulatory Visit: Payer: Self-pay | Admitting: Medical Genetics

## 2024-07-26 ENCOUNTER — Other Ambulatory Visit
Admission: RE | Admit: 2024-07-26 | Discharge: 2024-07-26 | Disposition: A | Discharge status: Home / Self Care | Payer: Self-pay | Source: Ambulatory Visit | Attending: Medical Genetics | Admitting: Medical Genetics

## 2024-08-06 LAB — GENECONNECT MOLECULAR SCREEN: Genetic Analysis Overall Interpretation: NEGATIVE
